# Patient Record
Sex: Male | Born: 1955 | Race: White | Hispanic: No | Smoking: Never smoker
Health system: Southern US, Community
[De-identification: ages and names within clinical notes are randomized; demographics above are authoritative.]

## PROBLEM LIST (undated history)

## (undated) DIAGNOSIS — J069 Acute upper respiratory infection, unspecified: Secondary | ICD-10-CM

## (undated) HISTORY — PX: OTHER SURGICAL HISTORY: SHX169

---

## 2000-06-24 ENCOUNTER — Encounter: Admission: RE | Admit: 2000-06-24 | Discharge: 2000-06-24 | Payer: Self-pay | Admitting: Family Medicine

## 2000-06-24 ENCOUNTER — Encounter: Payer: Self-pay | Admitting: Family Medicine

## 2014-09-07 ENCOUNTER — Emergency Department (HOSPITAL_BASED_OUTPATIENT_CLINIC_OR_DEPARTMENT_OTHER): Payer: BLUE CROSS/BLUE SHIELD

## 2014-09-07 ENCOUNTER — Emergency Department (HOSPITAL_BASED_OUTPATIENT_CLINIC_OR_DEPARTMENT_OTHER)
Admission: EM | Admit: 2014-09-07 | Discharge: 2014-09-08 | Disposition: A | Discharge status: Home / Self Care | Payer: BLUE CROSS/BLUE SHIELD | Attending: Emergency Medicine | Admitting: Emergency Medicine

## 2014-09-07 ENCOUNTER — Encounter (HOSPITAL_BASED_OUTPATIENT_CLINIC_OR_DEPARTMENT_OTHER): Payer: Self-pay | Admitting: Emergency Medicine

## 2014-09-07 DIAGNOSIS — R079 Chest pain, unspecified: Secondary | ICD-10-CM | POA: Diagnosis present

## 2014-09-07 DIAGNOSIS — R61 Generalized hyperhidrosis: Secondary | ICD-10-CM | POA: Diagnosis not present

## 2014-09-07 DIAGNOSIS — R05 Cough: Secondary | ICD-10-CM

## 2014-09-07 DIAGNOSIS — R059 Cough, unspecified: Secondary | ICD-10-CM

## 2014-09-07 DIAGNOSIS — F10120 Alcohol abuse with intoxication, uncomplicated: Secondary | ICD-10-CM | POA: Insufficient documentation

## 2014-09-07 DIAGNOSIS — Z8709 Personal history of other diseases of the respiratory system: Secondary | ICD-10-CM | POA: Diagnosis not present

## 2014-09-07 DIAGNOSIS — F10920 Alcohol use, unspecified with intoxication, uncomplicated: Secondary | ICD-10-CM

## 2014-09-07 HISTORY — DX: Acute upper respiratory infection, unspecified: J06.9

## 2014-09-07 LAB — BASIC METABOLIC PANEL
Anion gap: 9 (ref 5–15)
BUN: 12 mg/dL (ref 6–23)
CO2: 21 mmol/L (ref 19–32)
Calcium: 8.8 mg/dL (ref 8.4–10.5)
Chloride: 107 mEq/L (ref 96–112)
Creatinine, Ser: 1.03 mg/dL (ref 0.50–1.35)
GFR calc Af Amer: >90 mL/min (ref >90)
GFR calc non Af Amer: 78 mL/min — ABNORMAL LOW (ref >90)
Glucose, Bld: 135 mg/dL — ABNORMAL HIGH (ref 70–99)
Potassium: 3.7 mmol/L (ref 3.5–5.1)
Sodium: 137 mmol/L (ref 135–145)

## 2014-09-07 LAB — CBC WITH DIFFERENTIAL/PLATELET
Basophils Absolute: 0.1 10*3/uL (ref 0.0–0.1)
Basophils Relative: 1 % (ref 0–1)
Eosinophils Absolute: 0.2 10*3/uL (ref 0.0–0.7)
Eosinophils Relative: 3 % (ref 0–5)
HCT: 40.2 % (ref 39.0–52.0)
Hemoglobin: 14.1 g/dL (ref 13.0–17.0)
Lymphocytes Relative: 42 % (ref 12–46)
Lymphs Abs: 3.9 10*3/uL (ref 0.7–4.0)
MCH: 32 pg (ref 26.0–34.0)
MCHC: 35.1 g/dL (ref 30.0–36.0)
MCV: 91.2 fL (ref 78.0–100.0)
Monocytes Absolute: 0.7 10*3/uL (ref 0.1–1.0)
Monocytes Relative: 8 % (ref 3–12)
Neutro Abs: 4.4 10*3/uL (ref 1.7–7.7)
Neutrophils Relative %: 46 % (ref 43–77)
Platelets: 362 10*3/uL (ref 150–400)
RBC: 4.41 MIL/uL (ref 4.22–5.81)
RDW: 13.6 % (ref 11.5–15.5)
WBC: 9.3 10*3/uL (ref 4.0–10.5)

## 2014-09-07 LAB — ETHANOL: Alcohol, Ethyl (B): 243 mg/dL — ABNORMAL HIGH (ref 0–9)

## 2014-09-07 LAB — TROPONIN I: Troponin I: <0.03 ng/mL (ref <0.031)

## 2014-09-07 LAB — CBG MONITORING, ED: GLUCOSE-CAPILLARY: 125 mg/dL — AB (ref 70–99)

## 2014-09-07 MED ORDER — ASPIRIN 81 MG PO CHEW
324.0000 mg | CHEWABLE_TABLET | Freq: Once | ORAL | Status: AC
  Administered 2014-09-07: 324 mg via ORAL

## 2014-09-07 MED ORDER — ASPIRIN 81 MG PO CHEW: 324.0000 mg | CHEWABLE_TABLET | Freq: Once | ORAL | Status: AC

## 2014-09-07 MED ORDER — ASPIRIN 81 MG PO CHEW
CHEWABLE_TABLET | ORAL | Status: AC
  Filled 2014-09-07: qty 4

## 2014-09-07 NOTE — ED Notes (Signed)
Pt reports increased stress this week, tonight he had several cups of wine and his wife brought him to er because she thought he was having chest pain

## 2014-09-07 NOTE — ED Notes (Signed)
MD at bedside. 

## 2014-09-07 NOTE — ED Provider Notes (Signed)
CSN: [id number]     Arrival date & time 09/07/14  2253 History  This chart was scribed for Ray Seamen, MD by Richarda Overlie, ED Scribe. This patient was seen in room MH11/MH11 and the patient's care was started 11:00 PM.  Chief Complaint  Patient presents with  . Chest Pain   HPI HPI Comments: Ray Rogers is a 59 y.o. male who presents to the Emergency Department complaining of intermittent CP over the last couple weeks in his left pectoral region. He denies CP now or earlier this evening. He says this pain is sharp, located in the left pectoral region with some radiation to the left shoulder. It lasts only minutes at a time. The pain occurs when he is driving or when he is stressed. Pt states he has had a difficult work situation and lost his job today. He was at a party this evening and states he only had about 2 glasses of wine. While at the party he had a coughing episode and could not breath well. His wife states that he started sweating after the coughing episode. He states he had a cough earlier in the week and is on an unspecified antibiotic currently. He denies SOB, nausea and diaphoresis associated with the chest pain.     Past Medical History  Diagnosis Date  . URI (upper respiratory infection)    History reviewed. No pertinent past surgical history. History reviewed. No pertinent family history. History  Substance Use Topics  . Smoking status: Never Smoker   . Smokeless tobacco: Not on file  . Alcohol Use: Yes    Review of Systems  Constitutional: Positive for diaphoresis.  Respiratory: Positive for cough.   Cardiovascular: Positive for chest pain.  All other systems reviewed and are negative.   Allergies  Review of patient's allergies indicates no known allergies.  Home Medications   Prior to Admission medications   Not on File   BP 103/61 mmHg  Pulse 70  Temp(Src) 98.6 F (37 C) (Oral)  Resp 18  Ht  (1.702 m)  Wt 200 lb (90.719 kg)  BMI 31.32  kg/m2  SpO2 91%   Physical Exam General: Well-developed, well-nourished male in no acute distress; appearance consistent with age of record HENT: normocephalic; atraumatic Eyes: pupils equal, round and reactive to light; extraocular muscles intact Neck: supple Heart: regular rate and rhythm; no murmurs, rubs or gallops Lungs: Diminished breath sounds bilaterally, few rales in bases that cleared with deep breathing. Abdomen: soft; nondistended; nontender; no masses or hepatosplenomegaly; bowel sounds present Extremities: No deformity; full range of motion; pulses normal Neurologic: Awake, alert and oriented; motor function intact in all extremities and symmetric; no facial droop Skin: Warm and dry Psychiatric: Normal mood and affect  ED Course  Procedures     COORDINATION OF CARE: 11:07 PM Discussed treatment plan with pt at bedside and pt agreed to plan.    MDM   Nursing notes and vitals signs, including pulse oximetry, reviewed.  Summary of this visit's results, reviewed by myself:  Labs:  Results for orders placed or performed during the hospital encounter of 09/07/14 (from the past 24 hour(s))  CBC with Differential     Status: None   Collection Time: 09/07/14 11:00 PM  Result Value Ref Range   WBC 9.3 4.0 - 10.5 K/uL   RBC 4.41 4.22 - 5.81 MIL/uL   Hemoglobin 14.1 13.0 - 17.0 g/dL   HCT 11.9 14.7 - 82.9 %   MCV 91.2  78.0 - 100.0 fL   MCH 32.0 26.0 - 34.0 pg   MCHC 35.1 30.0 - 36.0 g/dL   RDW 16.113.6 09.611.5 - 04.515.5 %   Platelets 362 150 - 400 K/uL   Neutrophils Relative % 46 43 - 77 %   Neutro Abs 4.4 1.7 - 7.7 K/uL   Lymphocytes Relative 42 12 - 46 %   Lymphs Abs 3.9 0.7 - 4.0 K/uL   Monocytes Relative 8 3 - 12 %   Monocytes Absolute 0.7 0.1 - 1.0 K/uL   Eosinophils Relative 3 0 - 5 %   Eosinophils Absolute 0.2 0.0 - 0.7 K/uL   Basophils Relative 1 0 - 1 %   Basophils Absolute 0.1 0.0 - 0.1 K/uL  Basic metabolic panel     Status: Abnormal   Collection Time:  09/07/14 11:00 PM  Result Value Ref Range   Sodium 137 135 - 145 mmol/L   Potassium 3.7 3.5 - 5.1 mmol/L   Chloride 107 96 - 112 mEq/L   CO2 21 19 - 32 mmol/L   Glucose, Bld 135 (H) 70 - 99 mg/dL   BUN 12 6 - 23 mg/dL   Creatinine, Ser 4.091.03 0.50 - 1.35 mg/dL   Calcium 8.8 8.4 - 81.110.5 mg/dL   GFR calc non Af Amer 78 (L) >90 mL/min   GFR calc Af Amer >90 >90 mL/min   Anion gap 9 5 - 15  Troponin I     Status: None   Collection Time: 09/07/14 11:00 PM  Result Value Ref Range   Troponin I <0.03 <0.031 ng/mL  Ethanol     Status: Abnormal   Collection Time: 09/07/14 11:00 PM  Result Value Ref Range   Alcohol, Ethyl (B) 243 (H) 0 - 9 mg/dL  CBG monitoring, ED     Status: Abnormal   Collection Time: 09/07/14 11:12 PM  Result Value Ref Range   Glucose-Capillary 125 (H) 70 - 99 mg/dL  Drug screen panel, emergency     Status: None   Collection Time: 09/07/14 11:51 PM  Result Value Ref Range   Opiates NONE DETECTED NONE DETECTED   Cocaine NONE DETECTED NONE DETECTED   Benzodiazepines NONE DETECTED NONE DETECTED   Amphetamines NONE DETECTED NONE DETECTED   Tetrahydrocannabinol NONE DETECTED NONE DETECTED   Barbiturates NONE DETECTED NONE DETECTED  Troponin I     Status: None   Collection Time: 09/08/14  2:08 AM  Result Value Ref Range   Troponin I <0.03 <0.031 ng/mL    Imaging Studies: Dg Chest 2 View  09/07/2014   CLINICAL DATA:  Cough.  EXAM: CHEST  2 VIEW  COMPARISON:  None.  FINDINGS: Lung volumes are low. Minimal atelectasis in the right lower lobe. The heart is at the upper limits of normal, likely normal for technique. Mediastinal contours are normal. Pulmonary vasculature is normal. No consolidation to suggest pneumonia. No pleural effusion or pneumothorax. No acute osseous abnormalities are seen. Air-fluid level in the stomach, likely from ingested material.  IMPRESSION: Minimal atelectasis at the right lung base.   Electronically Signed   By: Rubye OaksMelanie  Ehinger M.D.   On:  09/07/2014 23:53   12:57 AM Patient resting comfortably. He has admitted to drinking more than he initially indicated.   I personally performed the services described in this documentation, which was scribed in my presence. The recorded information has been reviewed and is accurate.     Ray SeamenJohn L Grisell Bissette, MD 09/08/14 [phone]

## 2014-09-08 ENCOUNTER — Encounter (HOSPITAL_BASED_OUTPATIENT_CLINIC_OR_DEPARTMENT_OTHER): Payer: Self-pay | Admitting: Emergency Medicine

## 2014-09-08 LAB — RAPID URINE DRUG SCREEN, HOSP PERFORMED
Amphetamines: NOT DETECTED
BENZODIAZEPINES: NOT DETECTED
Barbiturates: NOT DETECTED
COCAINE: NOT DETECTED
Opiates: NOT DETECTED
TETRAHYDROCANNABINOL: NOT DETECTED

## 2014-09-08 LAB — TROPONIN I

## 2015-06-15 IMAGING — CR DG CHEST 2V
2 series · 2 of 2 positions shown · non-contrast
Comparison: None.

CLINICAL DATA: Cough.

EXAM:
CHEST  2 VIEW

[w chest pa]
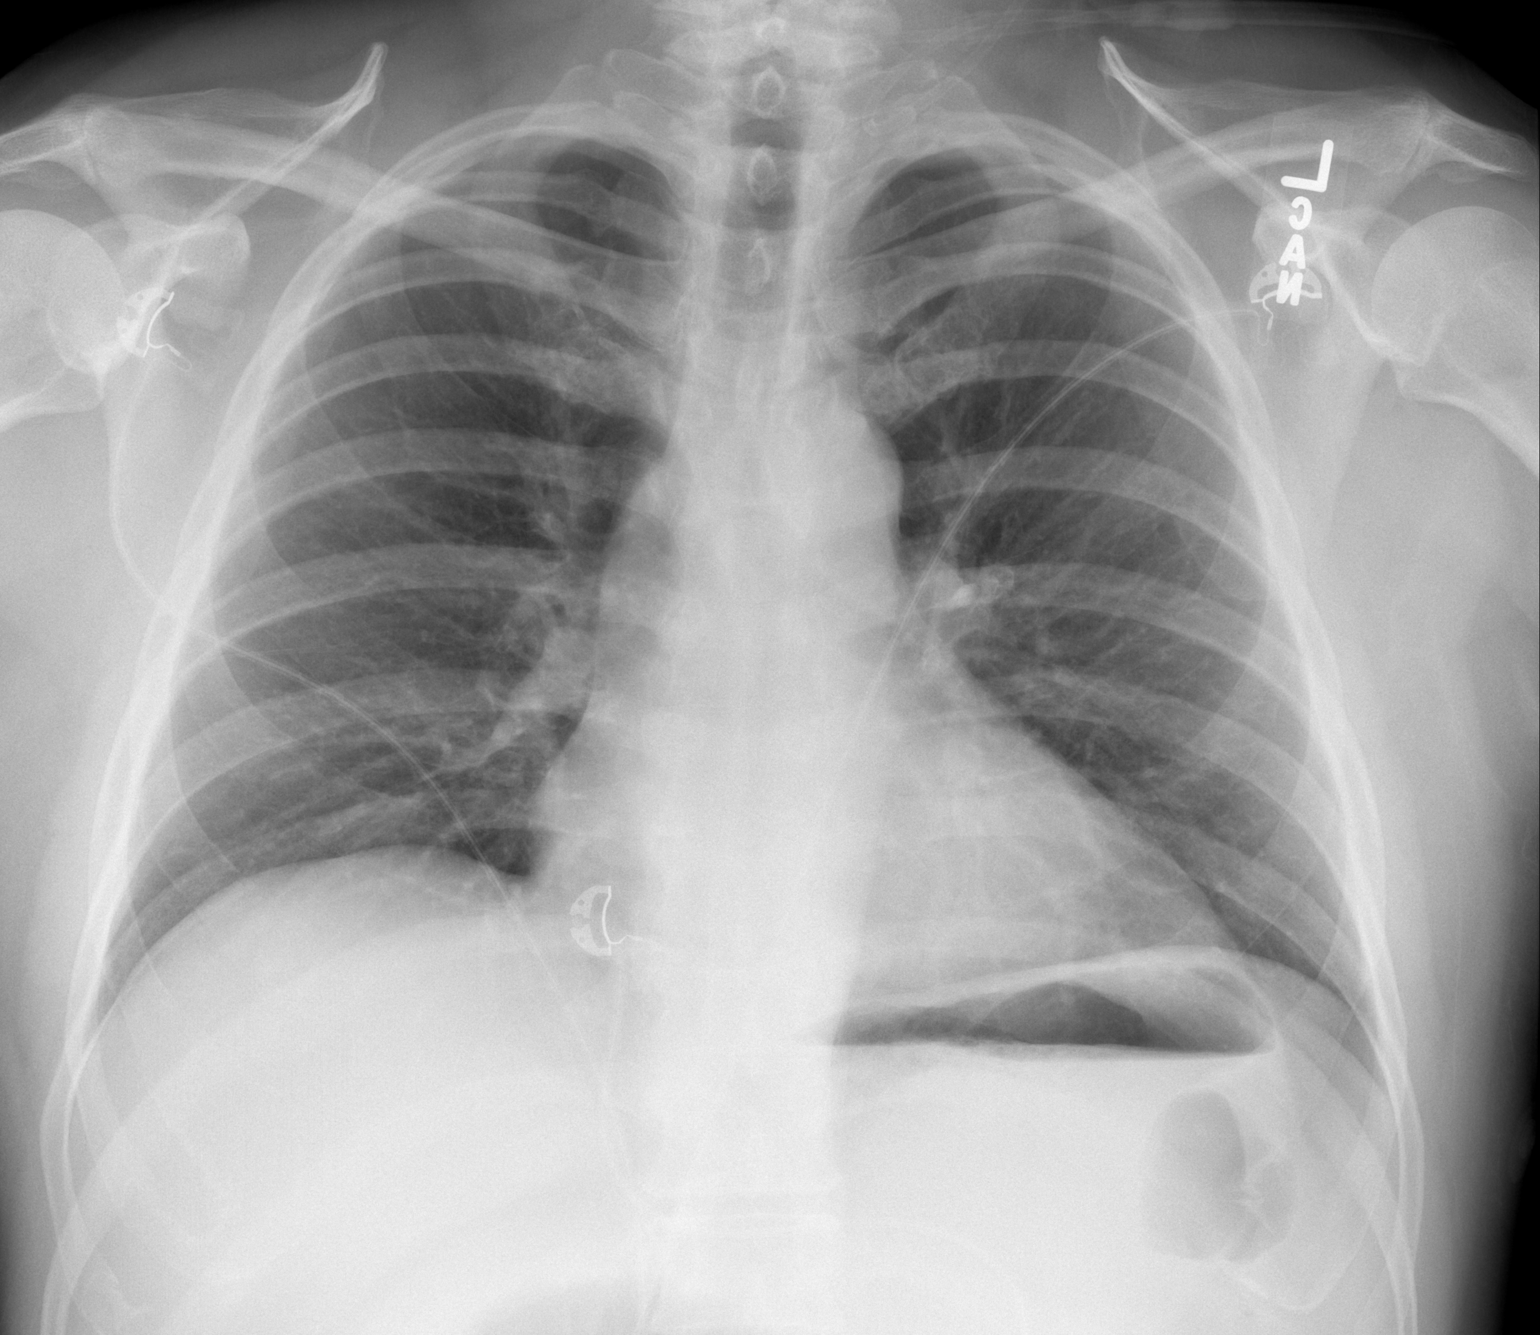

[w chest lat]
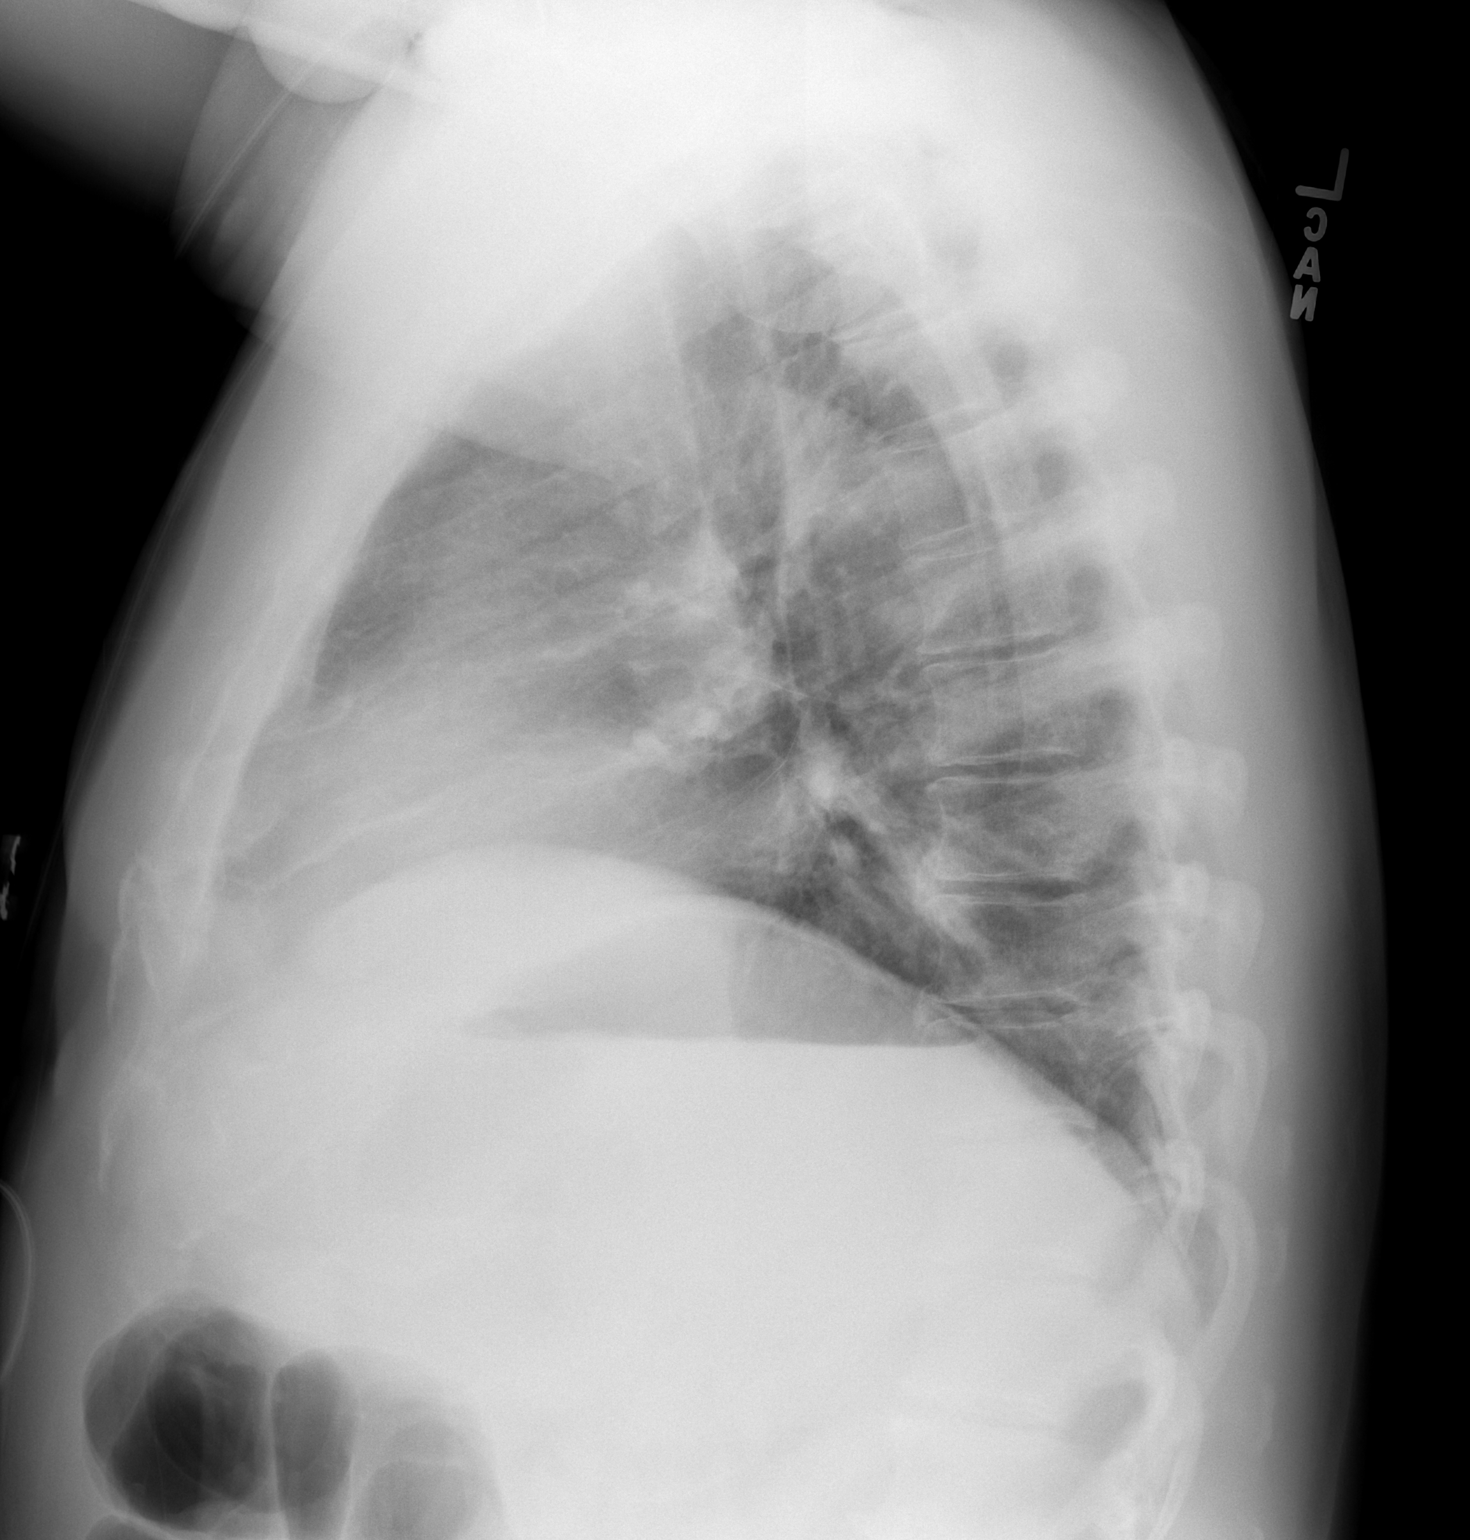

[2 of 2 positions shown; findings below may reference images not displayed]

FINDINGS: Lung volumes are low. Minimal atelectasis in the right lower lobe.
The heart is at the upper limits of normal, likely normal for
technique. Mediastinal contours are normal. Pulmonary vasculature is
normal. No consolidation to suggest pneumonia. No pleural effusion
or pneumothorax. No acute osseous abnormalities are seen. Air-fluid
level in the stomach, likely from ingested material.
IMPRESSION: Minimal atelectasis at the right lung base.

## 2018-04-05 DIAGNOSIS — Z136 Encounter for screening for cardiovascular disorders: Secondary | ICD-10-CM | POA: Diagnosis not present

## 2018-04-05 DIAGNOSIS — Z13220 Encounter for screening for lipoid disorders: Secondary | ICD-10-CM | POA: Diagnosis not present

## 2018-04-05 DIAGNOSIS — Z0000 Encounter for general adult medical examination without abnormal findings: Secondary | ICD-10-CM | POA: Diagnosis not present

## 2018-06-29 DIAGNOSIS — Z1211 Encounter for screening for malignant neoplasm of colon: Secondary | ICD-10-CM | POA: Diagnosis not present

## 2019-08-15 DIAGNOSIS — Z0000 Encounter for general adult medical examination without abnormal findings: Secondary | ICD-10-CM | POA: Diagnosis not present

## 2019-08-22 ENCOUNTER — Ambulatory Visit: Payer: BC Managed Care – PPO | Attending: Internal Medicine

## 2019-08-22 DIAGNOSIS — U071 COVID-19: Secondary | ICD-10-CM

## 2019-08-23 LAB — NOVEL CORONAVIRUS, NAA: SARS-CoV-2, NAA: NOT DETECTED

## 2022-05-29 ENCOUNTER — Other Ambulatory Visit (HOSPITAL_BASED_OUTPATIENT_CLINIC_OR_DEPARTMENT_OTHER): Payer: Self-pay | Admitting: Family Medicine

## 2022-05-29 DIAGNOSIS — M25551 Pain in right hip: Secondary | ICD-10-CM

## 2022-06-01 ENCOUNTER — Ambulatory Visit (HOSPITAL_BASED_OUTPATIENT_CLINIC_OR_DEPARTMENT_OTHER)
Admission: RE | Admit: 2022-06-01 | Discharge: 2022-06-01 | Disposition: A | Discharge status: Home / Self Care | Payer: Self-pay | Source: Ambulatory Visit | Attending: Family Medicine | Admitting: Family Medicine

## 2022-06-01 DIAGNOSIS — M25551 Pain in right hip: Secondary | ICD-10-CM | POA: Insufficient documentation
